# Patient Record
Sex: Male | Born: 1955 | Race: White | Hispanic: No | Marital: Married | Smoking: Never smoker
Health system: Southern US, Community
[De-identification: ages and names within clinical notes are randomized; demographics above are authoritative.]

## PROBLEM LIST (undated history)

## (undated) DIAGNOSIS — K635 Polyp of colon: Secondary | ICD-10-CM

## (undated) DIAGNOSIS — N200 Calculus of kidney: Secondary | ICD-10-CM

## (undated) DIAGNOSIS — N400 Enlarged prostate without lower urinary tract symptoms: Secondary | ICD-10-CM

## (undated) DIAGNOSIS — K219 Gastro-esophageal reflux disease without esophagitis: Secondary | ICD-10-CM

## (undated) DIAGNOSIS — IMO0002 Reserved for concepts with insufficient information to code with codable children: Secondary | ICD-10-CM

## (undated) HISTORY — DX: Reserved for concepts with insufficient information to code with codable children: IMO0002

## (undated) HISTORY — DX: Benign prostatic hyperplasia without lower urinary tract symptoms: N40.0

## (undated) HISTORY — DX: Polyp of colon: K63.5

## (undated) HISTORY — PX: CHOLECYSTECTOMY: SHX55

## (undated) HISTORY — DX: Gastro-esophageal reflux disease without esophagitis: K21.9

## (undated) HISTORY — DX: Calculus of kidney: N20.0

---

## 1998-06-26 ENCOUNTER — Emergency Department (HOSPITAL_COMMUNITY): Admission: EM | Admit: 1998-06-26 | Discharge: 1998-06-26 | Payer: Self-pay | Admitting: Emergency Medicine

## 2004-03-28 ENCOUNTER — Emergency Department (HOSPITAL_COMMUNITY): Admission: EM | Admit: 2004-03-28 | Discharge: 2004-03-28 | Payer: Self-pay | Admitting: Emergency Medicine

## 2005-10-23 ENCOUNTER — Emergency Department (HOSPITAL_COMMUNITY): Admission: EM | Admit: 2005-10-23 | Discharge: 2005-10-23 | Payer: Self-pay | Admitting: Emergency Medicine

## 2005-10-24 ENCOUNTER — Ambulatory Visit (HOSPITAL_BASED_OUTPATIENT_CLINIC_OR_DEPARTMENT_OTHER): Admission: RE | Admit: 2005-10-24 | Discharge: 2005-10-24 | Payer: Self-pay | Admitting: Urology

## 2005-10-24 ENCOUNTER — Ambulatory Visit (HOSPITAL_COMMUNITY): Admission: RE | Admit: 2005-10-24 | Discharge: 2005-10-24 | Payer: Self-pay | Admitting: Urology

## 2005-11-12 ENCOUNTER — Ambulatory Visit (HOSPITAL_COMMUNITY): Admission: RE | Admit: 2005-11-12 | Discharge: 2005-11-12 | Payer: Self-pay | Admitting: Urology

## 2006-07-07 IMAGING — CR DG ABDOMEN 1V
1 series · 1 of 1 positions shown · non-contrast
Comparison: None available.

CLINICAL DATA: Patient has a history of a right renal stone.  Preop.  
 ABDOMEN ? 1 VIEW:

[t abdomen supine]
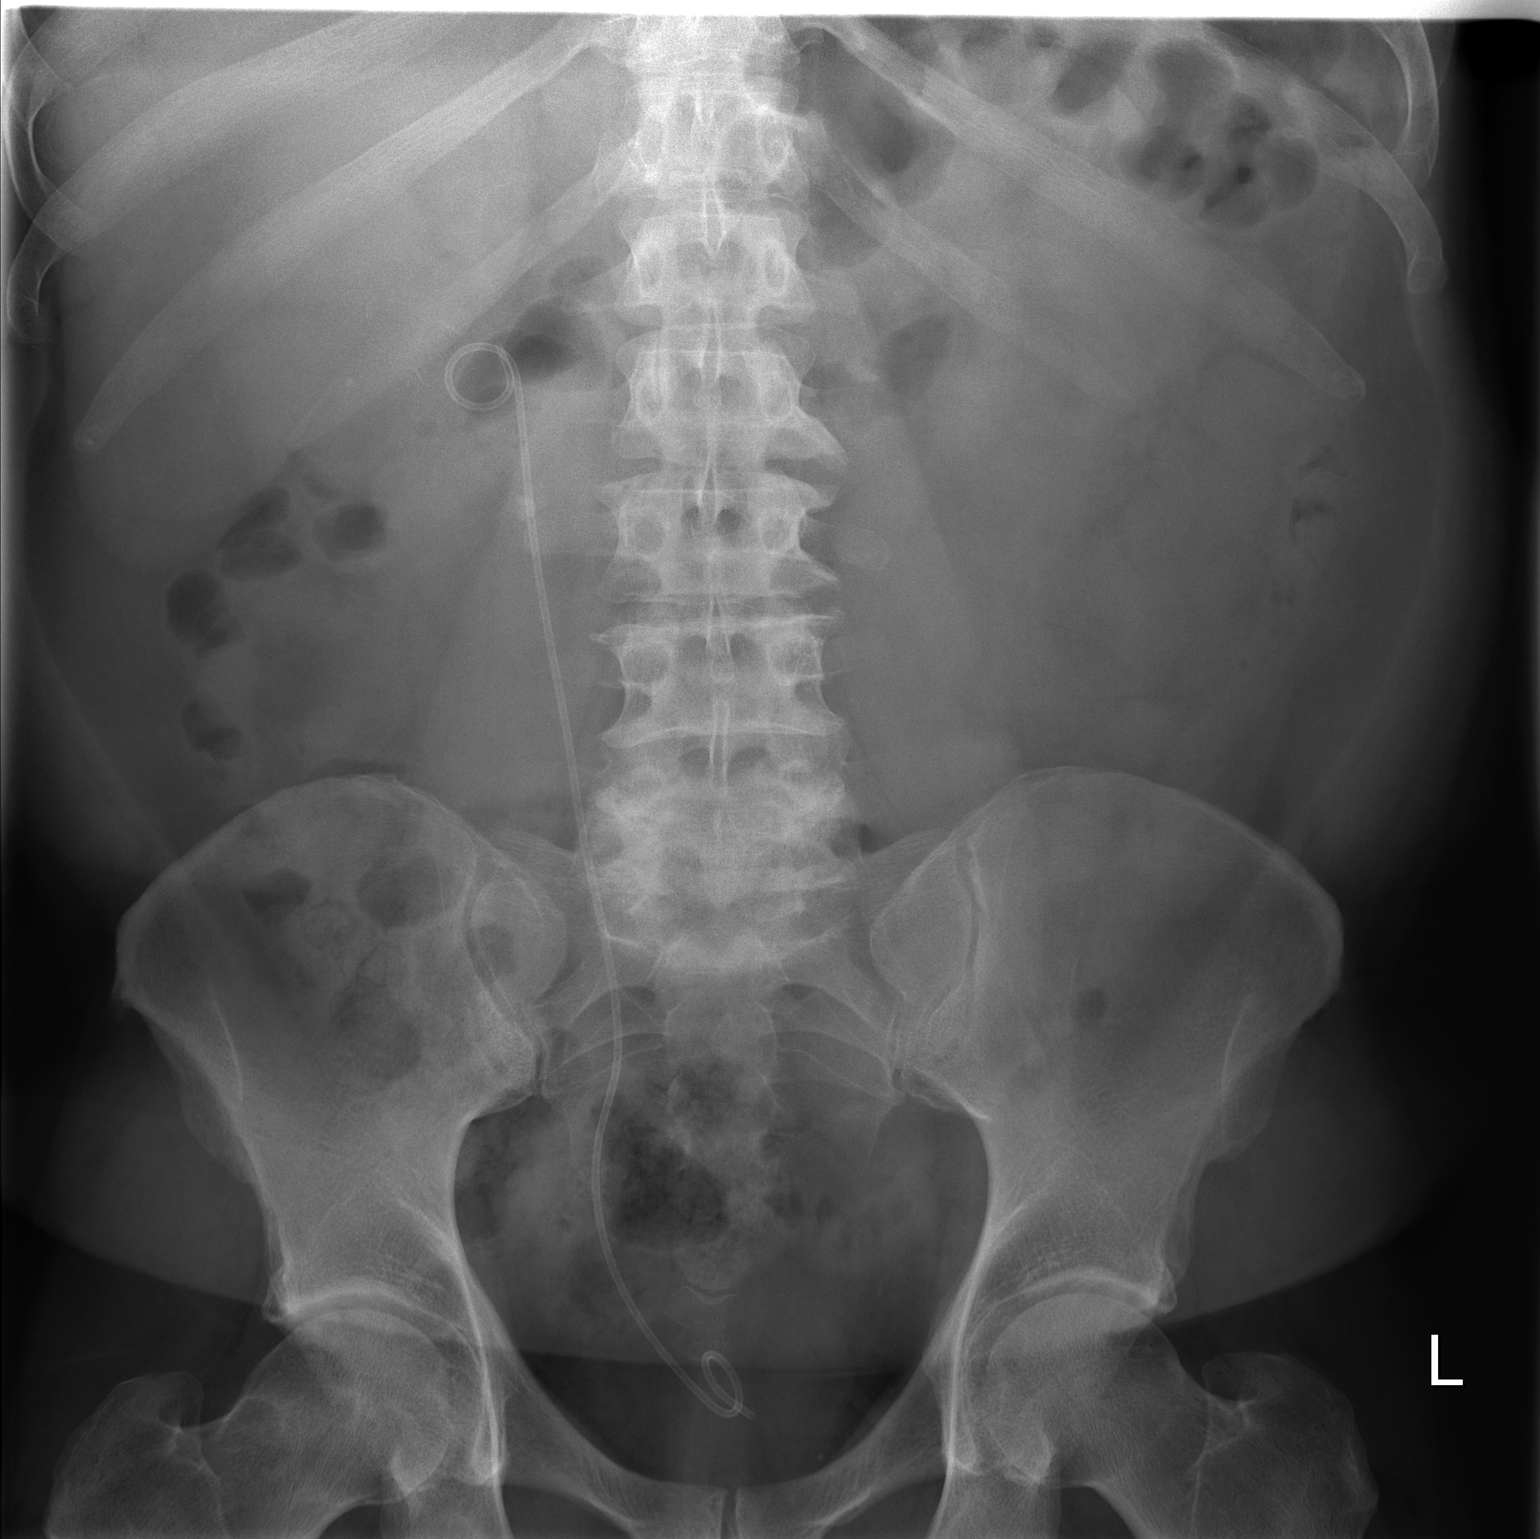

[1 of 1 positions shown; findings below may reference images not displayed]

FINDINGS: AP view of the abdomen reveals a right double J catheter to be in position.  There is a 6 mm calculus in the right proximal ureter.
IMPRESSION: Right proximal ureteral calculus.

## 2010-08-07 ENCOUNTER — Observation Stay (HOSPITAL_COMMUNITY): Admission: EM | Admit: 2010-08-07 | Discharge: 2010-08-08 | Payer: Self-pay | Admitting: Emergency Medicine

## 2010-08-07 ENCOUNTER — Ambulatory Visit: Payer: Self-pay | Admitting: Cardiology

## 2010-08-08 ENCOUNTER — Encounter (INDEPENDENT_AMBULATORY_CARE_PROVIDER_SITE_OTHER): Payer: Self-pay | Admitting: Emergency Medicine

## 2011-01-10 LAB — DIFFERENTIAL
Basophils Absolute: 0 10*3/uL (ref 0.0–0.1)
Eosinophils Relative: 1 % (ref 0–5)
Lymphocytes Relative: 34 % (ref 12–46)
Lymphs Abs: 1.6 10*3/uL (ref 0.7–4.0)
Monocytes Absolute: 0.3 10*3/uL (ref 0.1–1.0)
Monocytes Relative: 6 % (ref 3–12)

## 2011-01-10 LAB — BASIC METABOLIC PANEL
BUN: 16 mg/dL (ref 6–23)
Chloride: 106 mEq/L (ref 96–112)
Glucose, Bld: 107 mg/dL — ABNORMAL HIGH (ref 70–99)
Potassium: 4 mEq/L (ref 3.5–5.1)
Sodium: 139 mEq/L (ref 135–145)

## 2011-01-10 LAB — CBC
HCT: 42 % (ref 39.0–52.0)
Hemoglobin: 14 g/dL (ref 13.0–17.0)
MCV: 92.1 fL (ref 78.0–100.0)
RDW: 13.7 % (ref 11.5–15.5)
WBC: 4.7 10*3/uL (ref 4.0–10.5)

## 2011-01-10 LAB — POCT CARDIAC MARKERS
CKMB, poc: <1 ng/mL — ABNORMAL LOW (ref 1.0–8.0)
CKMB, poc: <1 ng/mL — ABNORMAL LOW (ref 1.0–8.0)
Myoglobin, poc: 76 ng/mL (ref 12–200)
Troponin i, poc: <0.05 ng/mL (ref 0.00–0.09)
Troponin i, poc: <0.05 ng/mL (ref 0.00–0.09)

## 2011-03-16 NOTE — Op Note (Signed)
NAME:  Jeffrey Hahn, Jeffrey Hahn             ACCOUNT NO.:  [id number]   MEDICAL RECORD NO.:  [id number]          PATIENT TYPE:  AMB   LOCATION:  [facility]                         FACILITY:  [facility]   PHYSICIAN:  Lindaann Slough, M.D.  DATE OF BIRTH:  1956/06/27   DATE OF PROCEDURE:  10/24/2005  DATE OF DISCHARGE:                                 OPERATIVE REPORT   PREOPERATIVE DIAGNOSIS:  Right ureteropelvic junction stone with  hydronephrosis.   POSTOPERATIVE DIAGNOSIS:  Right  ureteropelvic junction stone with  hydronephrosis.   PROCEDURE:  Cystoscopy, right retrograde pyelogram and insertion of double-J  catheter   SURGEON:  Danae Chen, M.D.   ANESTHESIA:  General.   INDICATION:  The patient is a 55 year old male who was seen in the office  this morning with a history of sudden onset of severe right flank pain  yesterday morning associated with nausea and vomiting. He went to the  emergency room and a CT scan showed a 6 mm stone at the right UPJ with  severe hydronephrosis. He has continued to complain of pain. He is scheduled  for cystoscopy, right retrograde pyelogram and insertion of double-J  catheter.   Under general anesthesia, the patient was prepped and draped and placed in  the dorsolithotomy position. A #22 Wappler cystoscope was inserted in the  bladder. The anterior urethra is normal. He has moderate prostatic  hypertrophy. The bladder mucosa is normal. There is no stone or tumor in the  bladder. The ureteral orifices are in normal position and shape.   RETROGRADE PYELOGRAM:   A cone-tip catheter was passed through the cystoscope and into the right  ureteral orifice. Contrast was then injected through the cone-tip catheter.  The ureter appears normal, there is no hydroureter. There is a stone at the  UPJ and the renal pelvis is moderately dilated. The calyces appear normal.  The cone-tip catheter was then removed.   A guidewire was then passed through the cystoscope and  into the right  ureter, then a #6-French - 26 double-J catheter was passed over the  guidewire. The proximal curl of the double-J catheter is in the renal pelvis  and the distal curl is in the bladder. The bladder was then emptied and the  cystoscope and guidewire were removed.   The patient tolerated the procedure well and left the OR in satisfactory  condition to post anesthesia care unit.      Lindaann Slough, M.D.  Electronically Signed    MN/MEDQ  D:  10/24/2005  T:  10/25/2005  Job:  [id number]

## 2011-03-19 ENCOUNTER — Other Ambulatory Visit: Payer: Self-pay | Admitting: Family Medicine

## 2011-03-19 ENCOUNTER — Ambulatory Visit
Admission: RE | Admit: 2011-03-19 | Discharge: 2011-03-19 | Disposition: A | Discharge status: Home / Self Care | Payer: 59 | Source: Ambulatory Visit | Attending: Family Medicine | Admitting: Family Medicine

## 2011-03-19 DIAGNOSIS — M542 Cervicalgia: Secondary | ICD-10-CM

## 2013-12-15 ENCOUNTER — Encounter: Payer: Self-pay | Admitting: Family Medicine

## 2013-12-25 ENCOUNTER — Encounter: Payer: Self-pay | Admitting: Family Medicine

## 2013-12-26 ENCOUNTER — Other Ambulatory Visit: Payer: Self-pay | Admitting: Family Medicine

## 2013-12-29 NOTE — Telephone Encounter (Signed)
Patient has not been seen by [facility]. Requires office visit before any refills can be given.    

## 2014-01-06 ENCOUNTER — Other Ambulatory Visit: Payer: 59

## 2014-01-06 DIAGNOSIS — Z0000 Encounter for general adult medical examination without abnormal findings: Secondary | ICD-10-CM

## 2014-01-06 LAB — CBC WITH DIFFERENTIAL/PLATELET
BASOS ABS: 0 10*3/uL (ref 0.0–0.1)
Basophils Relative: 0 % (ref 0–1)
EOS PCT: 3 % (ref 0–5)
Eosinophils Absolute: 0.1 10*3/uL (ref 0.0–0.7)
HCT: 41.1 % (ref 39.0–52.0)
Hemoglobin: 13.5 g/dL (ref 13.0–17.0)
LYMPHS PCT: 38 % (ref 12–46)
Lymphs Abs: 1.8 10*3/uL (ref 0.7–4.0)
MCH: 29.4 pg (ref 26.0–34.0)
MCHC: 32.8 g/dL (ref 30.0–36.0)
MCV: 89.5 fL (ref 78.0–100.0)
Monocytes Absolute: 0.3 10*3/uL (ref 0.1–1.0)
Monocytes Relative: 7 % (ref 3–12)
NEUTROS ABS: 2.4 10*3/uL (ref 1.7–7.7)
NEUTROS PCT: 52 % (ref 43–77)
PLATELETS: 238 10*3/uL (ref 150–400)
RBC: 4.59 MIL/uL (ref 4.22–5.81)
RDW: 14.1 % (ref 11.5–15.5)
WBC: 4.7 10*3/uL (ref 4.0–10.5)

## 2014-01-06 LAB — COMPREHENSIVE METABOLIC PANEL
ALT: 19 U/L (ref 0–53)
AST: 26 U/L (ref 0–37)
Albumin: 4 g/dL (ref 3.5–5.2)
Alkaline Phosphatase: 81 U/L (ref 39–117)
BUN: 18 mg/dL (ref 6–23)
CALCIUM: 9 mg/dL (ref 8.4–10.5)
CHLORIDE: 106 meq/L (ref 96–112)
CO2: 27 mEq/L (ref 19–32)
CREATININE: 0.82 mg/dL (ref 0.50–1.35)
Glucose, Bld: 91 mg/dL (ref 70–99)
POTASSIUM: 4.4 meq/L (ref 3.5–5.3)
Sodium: 143 mEq/L (ref 135–145)
Total Bilirubin: 0.5 mg/dL (ref 0.2–1.2)
Total Protein: 6.4 g/dL (ref 6.0–8.3)

## 2014-01-06 LAB — LIPID PANEL
CHOL/HDL RATIO: 3.8 ratio
Cholesterol: 165 mg/dL (ref 0–200)
HDL: 43 mg/dL (ref >39)
LDL Cholesterol: 112 mg/dL — ABNORMAL HIGH (ref 0–99)
Triglycerides: 51 mg/dL (ref <150)
VLDL: 10 mg/dL (ref 0–40)

## 2014-01-06 LAB — PSA: PSA: 2.35 ng/mL (ref <=4.00)

## 2014-01-08 ENCOUNTER — Encounter: Payer: Self-pay | Admitting: Family Medicine

## 2014-01-08 ENCOUNTER — Ambulatory Visit (INDEPENDENT_AMBULATORY_CARE_PROVIDER_SITE_OTHER): Payer: 59 | Admitting: Family Medicine

## 2014-01-08 VITALS — BP 136/84 | HR 74 | Temp 97.4°F | Resp 16 | Ht 74.0 in | Wt 251.0 lb

## 2014-01-08 DIAGNOSIS — Z0000 Encounter for general adult medical examination without abnormal findings: Secondary | ICD-10-CM

## 2014-01-08 DIAGNOSIS — N400 Enlarged prostate without lower urinary tract symptoms: Secondary | ICD-10-CM | POA: Insufficient documentation

## 2014-01-08 DIAGNOSIS — K635 Polyp of colon: Secondary | ICD-10-CM | POA: Insufficient documentation

## 2014-01-08 NOTE — Progress Notes (Signed)
Subjective:    Patient ID: Jeffrey Hahn, male    DOB: January 01, 1956, 58 y.o.   MRN: [id number]  HPI  Patient is here today for complete physical exam. He has no concerns. He has stable BPH. He experiences nocturia on occasion. However his symptoms are much improved on Flomax. His PSA has risen slightly from 2-2.3.  Patient's tetanus shot is up to date. His colonoscopy is due in 2017 for his history of colon polyps. Otherwise his preventive care is up to date. His most recent labwork as listed below: Lab on 01/06/2014  Component Date Value Ref Range Status  . WBC 01/06/2014 4.7  4.0 - 10.5 K/uL Final  . RBC 01/06/2014 4.59  4.22 - 5.81 MIL/uL Final  . Hemoglobin 01/06/2014 13.5  13.0 - 17.0 g/dL Final  . HCT 01/06/2014 41.1  39.0 - 52.0 % Final  . MCV 01/06/2014 89.5  78.0 - 100.0 fL Final  . MCH 01/06/2014 29.4  26.0 - 34.0 pg Final  . MCHC 01/06/2014 32.8  30.0 - 36.0 g/dL Final  . RDW 01/06/2014 14.1  11.5 - 15.5 % Final  . Platelets 01/06/2014 238  150 - 400 K/uL Final  . Neutrophils Relative % 01/06/2014 52  43 - 77 % Final  . Neutro Abs 01/06/2014 2.4  1.7 - 7.7 K/uL Final  . Lymphocytes Relative 01/06/2014 38  12 - 46 % Final  . Lymphs Abs 01/06/2014 1.8  0.7 - 4.0 K/uL Final  . Monocytes Relative 01/06/2014 7  3 - 12 % Final  . Monocytes Absolute 01/06/2014 0.3  0.1 - 1.0 K/uL Final  . Eosinophils Relative 01/06/2014 3  0 - 5 % Final  . Eosinophils Absolute 01/06/2014 0.1  0.0 - 0.7 K/uL Final  . Basophils Relative 01/06/2014 0  0 - 1 % Final  . Basophils Absolute 01/06/2014 0.0  0.0 - 0.1 K/uL Final  . Smear Review 01/06/2014 Criteria for review not met   Final  . Sodium 01/06/2014 143  135 - 145 mEq/L Final  . Potassium 01/06/2014 4.4  3.5 - 5.3 mEq/L Final  . Chloride 01/06/2014 106  96 - 112 mEq/L Final  . CO2 01/06/2014 27  19 - 32 mEq/L Final  . Glucose, Bld 01/06/2014 91  70 - 99 mg/dL Final  . BUN 01/06/2014 18  6 - 23 mg/dL Final  . Creat 01/06/2014 0.82  0.50 -  1.35 mg/dL Final  . Total Bilirubin 01/06/2014 0.5  0.2 - 1.2 mg/dL Final  . Alkaline Phosphatase 01/06/2014 81  39 - 117 U/L Final  . AST 01/06/2014 26  0 - 37 U/L Final  . ALT 01/06/2014 19  0 - 53 U/L Final  . Total Protein 01/06/2014 6.4  6.0 - 8.3 g/dL Final  . Albumin 01/06/2014 4.0  3.5 - 5.2 g/dL Final  . Calcium 01/06/2014 9.0  8.4 - 10.5 mg/dL Final  . Cholesterol 01/06/2014 165  0 - 200 mg/dL Final   Comment: ATP III Classification:                                < 200        mg/dL        Desirable                               200 - 239  mg/dL        Borderline High                               >= 240        mg/dL        High                             . Triglycerides 01/06/2014 51  <150 mg/dL Final  . HDL 01/06/2014 43  >39 mg/dL Final  . Total CHOL/HDL Ratio 01/06/2014 3.8   Final  . VLDL 01/06/2014 10  0 - 40 mg/dL Final  . LDL Cholesterol 01/06/2014 112* 0 - 99 mg/dL Final   Comment:                            Total Cholesterol/HDL Ratio:CHD Risk                                                 Coronary Heart Disease Risk Table                                                                 Men       Women                                   1/2 Average Risk              3.4        3.3                                       Average Risk              5.0        4.4                                    2X Average Risk              9.6        7.1                                    3X Average Risk             23.4       11.0                          Use the calculated Patient Ratio above and the CHD Risk table                           to determine the patient's CHD  Risk.                          ATP III Classification (LDL):                                < 100        mg/dL         Optimal                               100 - 129     mg/dL         Near or Above Optimal                               130 - 159     mg/dL         Borderline High                                160 - 189     mg/dL         High                                > 190        mg/dL         Very High                             . PSA 01/06/2014 2.35  <=4.00 ng/mL Final   Comment: Test Methodology: ECLIA PSA (Electrochemiluminescence Immunoassay)                                                     For PSA values from 2.5-4.0, particularly in younger men <60 years                          old, the AUA and NCCN suggest testing for % Free PSA (3515) and                          evaluation of the rate of increase in PSA (PSA velocity).   Past Medical History  Diagnosis Date  . BPH (benign prostatic hyperplasia)   . GERD (gastroesophageal reflux disease)   . Colon polyps   . Ulcer    Past Surgical History  Procedure Laterality Date  . Cholecystectomy     No current outpatient prescriptions on file prior to visit.   No current facility-administered medications on file prior to visit.   Allergies  Allergen Reactions  . Avelox [Moxifloxacin Hcl In Nacl] Hives and Shortness Of Breath  . Penicillins Hives and Shortness Of Breath   History   Social History  . Marital Status: Married    Spouse Name: N/A    Number of Children: N/A  . Years of Education: N/A   Occupational History  . Not on file.   Social History Main Topics  . Smoking  status: Never Smoker   . Smokeless tobacco: Not on file  . Alcohol Use: No  . Drug Use: No  . Sexual Activity: Yes     Comment: Married, works at [employer].   Other Topics Concern  . Not on file   Social History Narrative  . No narrative on file   Family History  Problem Relation Age of Onset  . Heart disease Brother 87    MI      Review of Systems  All other systems reviewed and are negative.       Objective:   Physical Exam  Vitals reviewed. Constitutional: He is oriented to person, place, and time. He appears well-developed and well-nourished. No distress.  HENT:  Head: Normocephalic and atraumatic.  Right Ear:  External ear normal.  Left Ear: External ear normal.  Nose: Nose normal.  Mouth/Throat: Oropharynx is clear and moist. No oropharyngeal exudate.  Eyes: Conjunctivae and EOM are normal. Pupils are equal, round, and reactive to light. Right eye exhibits no discharge. Left eye exhibits no discharge. No scleral icterus.  Neck: Normal range of motion. Neck supple. No JVD present. No tracheal deviation present. No thyromegaly present.  Cardiovascular: Normal rate, regular rhythm, normal heart sounds and intact distal pulses.  Exam reveals no gallop and no friction rub.   No murmur heard. Pulmonary/Chest: Effort normal and breath sounds normal. No stridor. No respiratory distress. He has no wheezes. He has no rales. He exhibits no tenderness.  Abdominal: Soft. Bowel sounds are normal. He exhibits no distension and no mass. There is no tenderness. There is no rebound and no guarding.  Genitourinary: Rectum normal and penis normal.  Musculoskeletal: Normal range of motion. He exhibits no edema and no tenderness.  Lymphadenopathy:    He has no cervical adenopathy.  Neurological: He is alert and oriented to person, place, and time. He has normal reflexes. He displays normal reflexes. No cranial nerve deficit. He exhibits normal muscle tone. Coordination normal.  Skin: Skin is warm. No rash noted. He is not diaphoretic. No erythema. No pallor.  Psychiatric: He has a normal mood and affect. His behavior is normal. Judgment and thought content normal.   prostate is enlarged +1 but there are no masses.        Assessment & Plan:  1. Routine general medical examination at a health care facility Patient's physical exam is completely normal aside from his obesity. I recommended that he lose 10-15 pounds of weight and increase his aerobic exercise. I will write him checking his blood pressure more frequently and notify me if it is greater than 140/90. Otherwise his labs are within normal range. His cancer  screenings are up-to-date. His immunizations are up-to-date. Recheck in one year or as needed.

## 2014-01-19 ENCOUNTER — Telehealth: Payer: Self-pay | Admitting: Family Medicine

## 2014-01-19 MED ORDER — TAMSULOSIN HCL 0.4 MG PO CAPS: 0.4000 mg | ORAL_CAPSULE | Freq: Every day | ORAL | Status: AC

## 2014-01-19 NOTE — Telephone Encounter (Signed)
Call back number [phone] [facility] is [facility] [address] [place] is needing tamsulosin (FLOMAX) 0.4 MG CAPS capsule

## 2014-01-19 NOTE — Telephone Encounter (Signed)
Rx Refilled  

## 2014-04-14 ENCOUNTER — Ambulatory Visit: Payer: 59 | Admitting: Physician Assistant

## 2015-01-10 ENCOUNTER — Other Ambulatory Visit: Payer: 59

## 2015-01-10 DIAGNOSIS — Z79899 Other long term (current) drug therapy: Secondary | ICD-10-CM

## 2015-01-10 DIAGNOSIS — Z0000 Encounter for general adult medical examination without abnormal findings: Secondary | ICD-10-CM

## 2015-01-10 DIAGNOSIS — N400 Enlarged prostate without lower urinary tract symptoms: Secondary | ICD-10-CM

## 2015-01-10 DIAGNOSIS — E785 Hyperlipidemia, unspecified: Secondary | ICD-10-CM

## 2015-01-10 LAB — COMPLETE METABOLIC PANEL WITH GFR
ALBUMIN: 4.2 g/dL (ref 3.5–5.2)
ALK PHOS: 83 U/L (ref 39–117)
ALT: 16 U/L (ref 0–53)
AST: 18 U/L (ref 0–37)
BILIRUBIN TOTAL: 0.7 mg/dL (ref 0.2–1.2)
BUN: 24 mg/dL — ABNORMAL HIGH (ref 6–23)
CHLORIDE: 105 meq/L (ref 96–112)
CO2: 26 mEq/L (ref 19–32)
Calcium: 9 mg/dL (ref 8.4–10.5)
Creat: 0.84 mg/dL (ref 0.50–1.35)
GFR, Est African American: >89 mL/min
Glucose, Bld: 92 mg/dL (ref 70–99)
POTASSIUM: 4.5 meq/L (ref 3.5–5.3)
SODIUM: 141 meq/L (ref 135–145)
TOTAL PROTEIN: 6.4 g/dL (ref 6.0–8.3)

## 2015-01-10 LAB — LIPID PANEL
Cholesterol: 185 mg/dL (ref 0–200)
HDL: 53 mg/dL (ref >=40)
LDL Cholesterol: 121 mg/dL — ABNORMAL HIGH (ref 0–99)
Total CHOL/HDL Ratio: 3.5 Ratio
Triglycerides: 55 mg/dL (ref <150)
VLDL: 11 mg/dL (ref 0–40)

## 2015-01-10 LAB — CBC WITH DIFFERENTIAL/PLATELET
BASOS PCT: 1 % (ref 0–1)
Basophils Absolute: 0.1 10*3/uL (ref 0.0–0.1)
EOS ABS: 0.2 10*3/uL (ref 0.0–0.7)
Eosinophils Relative: 3 % (ref 0–5)
HEMATOCRIT: 44.4 % (ref 39.0–52.0)
HEMOGLOBIN: 14.6 g/dL (ref 13.0–17.0)
LYMPHS ABS: 1.9 10*3/uL (ref 0.7–4.0)
Lymphocytes Relative: 37 % (ref 12–46)
MCH: 30.3 pg (ref 26.0–34.0)
MCHC: 32.9 g/dL (ref 30.0–36.0)
MCV: 92.1 fL (ref 78.0–100.0)
MONO ABS: 0.4 10*3/uL (ref 0.1–1.0)
MONOS PCT: 7 % (ref 3–12)
MPV: 10.1 fL (ref 8.6–12.4)
Neutro Abs: 2.7 10*3/uL (ref 1.7–7.7)
Neutrophils Relative %: 52 % (ref 43–77)
Platelets: 230 10*3/uL (ref 150–400)
RBC: 4.82 MIL/uL (ref 4.22–5.81)
RDW: 14.3 % (ref 11.5–15.5)
WBC: 5.1 10*3/uL (ref 4.0–10.5)

## 2015-01-10 LAB — TSH: TSH: 0.672 u[IU]/mL (ref 0.350–4.500)

## 2015-01-11 ENCOUNTER — Encounter: Payer: Self-pay | Admitting: Family Medicine

## 2015-01-11 ENCOUNTER — Ambulatory Visit (INDEPENDENT_AMBULATORY_CARE_PROVIDER_SITE_OTHER): Payer: 59 | Admitting: Family Medicine

## 2015-01-11 VITALS — BP 138/80 | HR 76 | Temp 98.1°F | Resp 18 | Ht 74.0 in | Wt 251.0 lb

## 2015-01-11 DIAGNOSIS — Z0000 Encounter for general adult medical examination without abnormal findings: Secondary | ICD-10-CM | POA: Diagnosis not present

## 2015-01-11 DIAGNOSIS — G4710 Hypersomnia, unspecified: Secondary | ICD-10-CM

## 2015-01-11 LAB — PSA: PSA: 2.88 ng/mL (ref <=4.00)

## 2015-01-11 NOTE — Progress Notes (Signed)
Subjective:    Patient ID: Jeffrey Hahn, male    DOB: 01/25/1956, 59 y.o.   MRN: [id number]  HPI Patient is here today for complete physical exam.  He does complain of hypersomnia. He has had apneic episodes in the middle of the night that wakes him up refills like he can't breathe. He feels tired frequently throughout the day. He has headaches frequently throughout the day. He does not feel rested. His last colonoscopy was approximately 3 years ago. He is scheduled to get a colonoscopy this year at his gastroenterologist and they schedule this. His prostate is checked regularly by his urologist. It was last checked 6 months ago and was normal. His most recent lab work as listed below: Lab on 01/10/2015  Component Date Value Ref Range Status  . Sodium 01/10/2015 141  135 - 145 mEq/L Final  . Potassium 01/10/2015 4.5  3.5 - 5.3 mEq/L Final  . Chloride 01/10/2015 105  96 - 112 mEq/L Final  . CO2 01/10/2015 26  19 - 32 mEq/L Final  . Glucose, Bld 01/10/2015 92  70 - 99 mg/dL Final  . BUN 01/10/2015 24* 6 - 23 mg/dL Final  . Creat 01/10/2015 0.84  0.50 - 1.35 mg/dL Final  . Total Bilirubin 01/10/2015 0.7  0.2 - 1.2 mg/dL Final  . Alkaline Phosphatase 01/10/2015 83  39 - 117 U/L Final  . AST 01/10/2015 18  0 - 37 U/L Final  . ALT 01/10/2015 16  0 - 53 U/L Final  . Total Protein 01/10/2015 6.4  6.0 - 8.3 g/dL Final  . Albumin 01/10/2015 4.2  3.5 - 5.2 g/dL Final  . Calcium 01/10/2015 9.0  8.4 - 10.5 mg/dL Final  . GFR, Est African American 01/10/2015 >89   Final  . GFR, Est Non African American 01/10/2015 >89   Final   Comment:   The estimated GFR is a calculation valid for adults (>=3 years old) that uses the CKD-EPI algorithm to adjust for age and sex. It is   not to be used for children, pregnant women, hospitalized patients,    patients on dialysis, or with rapidly changing kidney function. According to the NKDEP, eGFR >89 is normal, 60-89 shows mild impairment, 30-59 shows  moderate impairment, 15-29 shows severe impairment and <15 is ESRD.     . TSH 01/10/2015 0.672  0.350 - 4.500 uIU/mL Final  . Cholesterol 01/10/2015 185  0 - 200 mg/dL Final   Comment: ATP III Classification:       < 200        mg/dL        Desirable      200 - 239     mg/dL        Borderline High      >= 240        mg/dL        High     . Triglycerides 01/10/2015 55  <150 mg/dL Final  . HDL 01/10/2015 53  >=40 mg/dL Final   ** Please note change in reference range(s). **  . Total CHOL/HDL Ratio 01/10/2015 3.5   Final  . VLDL 01/10/2015 11  0 - 40 mg/dL Final  . LDL Cholesterol 01/10/2015 121* 0 - 99 mg/dL Final   Comment:   Total Cholesterol/HDL Ratio:CHD Risk                        Coronary Heart Disease Risk Table  Men       Women          1/2 Average Risk              3.4        3.3              Average Risk              5.0        4.4           2X Average Risk              9.6        7.1           3X Average Risk             23.4       11.0 Use the calculated Patient Ratio above and the CHD Risk table  to determine the patient's CHD Risk. ATP III Classification (LDL):       < 100        mg/dL         Optimal      100 - 129     mg/dL         Near or Above Optimal      130 - 159     mg/dL         Borderline High      160 - 189     mg/dL         High       > 190        mg/dL         Very High     . WBC 01/10/2015 5.1  4.0 - 10.5 K/uL Final  . RBC 01/10/2015 4.82  4.22 - 5.81 MIL/uL Final  . Hemoglobin 01/10/2015 14.6  13.0 - 17.0 g/dL Final  . HCT 01/10/2015 44.4  39.0 - 52.0 % Final  . MCV 01/10/2015 92.1  78.0 - 100.0 fL Final  . MCH 01/10/2015 30.3  26.0 - 34.0 pg Final  . MCHC 01/10/2015 32.9  30.0 - 36.0 g/dL Final  . RDW 01/10/2015 14.3  11.5 - 15.5 % Final  . Platelets 01/10/2015 230  150 - 400 K/uL Final  . MPV 01/10/2015 10.1  8.6 - 12.4 fL Final  . Neutrophils Relative % 01/10/2015 52  43 - 77 % Final  . Neutro Abs  01/10/2015 2.7  1.7 - 7.7 K/uL Final  . Lymphocytes Relative 01/10/2015 37  12 - 46 % Final  . Lymphs Abs 01/10/2015 1.9  0.7 - 4.0 K/uL Final  . Monocytes Relative 01/10/2015 7  3 - 12 % Final  . Monocytes Absolute 01/10/2015 0.4  0.1 - 1.0 K/uL Final  . Eosinophils Relative 01/10/2015 3  0 - 5 % Final  . Eosinophils Absolute 01/10/2015 0.2  0.0 - 0.7 K/uL Final  . Basophils Relative 01/10/2015 1  0 - 1 % Final  . Basophils Absolute 01/10/2015 0.1  0.0 - 0.1 K/uL Final  . Smear Review 01/10/2015 Criteria for review not met   Final  . PSA 01/10/2015 2.88  <=4.00 ng/mL Final   Comment: Test Methodology: ECLIA PSA (Electrochemiluminescence Immunoassay)   For PSA values from 2.5-4.0, particularly in younger men <80 years old, the AUA and NCCN suggest testing for % Free PSA (3515) and evaluation of the rate of increase in PSA (PSA velocity).    Past Medical History  Diagnosis  Date  . BPH (benign prostatic hyperplasia)   . GERD (gastroesophageal reflux disease)   . Colon polyps   . Ulcer    Past Surgical History  Procedure Laterality Date  . Cholecystectomy     Current Outpatient Prescriptions on File Prior to Visit  Medication Sig Dispense Refill  . Omega-3 Fatty Acids (FISH OIL PO) Take by mouth.    . RABEprazole (ACIPHEX) 20 MG tablet Take 20 mg by mouth daily.    . tamsulosin (FLOMAX) 0.4 MG CAPS capsule Take 1 capsule (0.4 mg total) by mouth daily. 30 capsule 11   No current facility-administered medications on file prior to visit.   Allergies  Allergen Reactions  . Avelox [Moxifloxacin Hcl In Nacl] Hives and Shortness Of Breath  . Penicillins Hives and Shortness Of Breath   History   Social History  . Marital Status: Married    Spouse Name: N/A  . Number of Children: N/A  . Years of Education: N/A   Occupational History  . Not on file.   Social History Main Topics  . Smoking status: Never Smoker   . Smokeless tobacco: Not on file  . Alcohol Use: No  . Drug  Use: No  . Sexual Activity: Yes     Comment: Married, works at [employer].   Other Topics Concern  . Not on file   Social History Narrative   Family History  Problem Relation Age of Onset  . Heart disease Brother 6    MI      Review of Systems  All other systems reviewed and are negative.      Objective:   Physical Exam  Constitutional: He is oriented to person, place, and time. He appears well-developed and well-nourished. No distress.  HENT:  Head: Normocephalic and atraumatic.  Right Ear: External ear normal.  Left Ear: External ear normal.  Nose: Nose normal.  Mouth/Throat: Oropharynx is clear and moist. No oropharyngeal exudate.  Eyes: Conjunctivae and EOM are normal. Pupils are equal, round, and reactive to light. Right eye exhibits no discharge. Left eye exhibits no discharge. No scleral icterus.  Neck: Normal range of motion. Neck supple. No JVD present. No tracheal deviation present. No thyromegaly present.  Cardiovascular: Normal rate, regular rhythm, normal heart sounds and intact distal pulses.  Exam reveals no gallop and no friction rub.   No murmur heard. Pulmonary/Chest: Effort normal and breath sounds normal. No stridor. No respiratory distress. He has no wheezes. He has no rales. He exhibits no tenderness.  Abdominal: Soft. Bowel sounds are normal. He exhibits no distension and no mass. There is no tenderness. There is no rebound and no guarding.  Musculoskeletal: Normal range of motion. He exhibits no edema or tenderness.  Lymphadenopathy:    He has no cervical adenopathy.  Neurological: He is alert and oriented to person, place, and time. He has normal reflexes. He displays normal reflexes. No cranial nerve deficit. He exhibits normal muscle tone. Coordination normal.  Skin: Skin is warm. No rash noted. He is not diaphoretic. No erythema. No pallor.  Psychiatric: He has a normal mood and affect. His behavior is normal. Judgment and thought content  normal.  Vitals reviewed.         Assessment & Plan:  Hypersomnia - Plan: Nocturnal polysomnography (NPSG)  Routine general medical examination at a health care facility  Patient's physical exam is normal. I will schedule the patient for a sleep study at his request. I believe the patient is also  likely having panic attacks at night. I discussed this with him that he would like to see a sleep study first. Cancer screening is up-to-date. Lab work is excellent. Recheck in one year or as needed.

## 2015-01-18 ENCOUNTER — Ambulatory Visit (INDEPENDENT_AMBULATORY_CARE_PROVIDER_SITE_OTHER): Payer: 59 | Admitting: Family Medicine

## 2015-01-18 ENCOUNTER — Encounter: Payer: Self-pay | Admitting: Family Medicine

## 2015-01-18 VITALS — BP 136/74 | HR 82 | Temp 98.2°F | Resp 16 | Ht 74.0 in | Wt 249.0 lb

## 2015-01-18 DIAGNOSIS — R319 Hematuria, unspecified: Secondary | ICD-10-CM

## 2015-01-18 LAB — URINALYSIS, ROUTINE W REFLEX MICROSCOPIC
Bilirubin Urine: NEGATIVE
GLUCOSE, UA: NEGATIVE mg/dL
Hgb urine dipstick: NEGATIVE
Ketones, ur: NEGATIVE mg/dL
LEUKOCYTES UA: NEGATIVE
Nitrite: NEGATIVE
PH: 6.5 (ref 5.0–8.0)
Protein, ur: NEGATIVE mg/dL
SPECIFIC GRAVITY, URINE: 1.025 (ref 1.005–1.030)
Urobilinogen, UA: 1 mg/dL (ref 0.0–1.0)

## 2015-01-18 NOTE — Progress Notes (Signed)
   Subjective:    Patient ID: Jeffrey Hahn, male    DOB: April 14, 1956, 59 y.o.   MRN: [id number]  Patient presents for Hematuria  patient here for recheck urinalysis. He was seen by his neurologist a few weeks ago at that time they get the standard urinalysis test as he has history of recurrent kidney stones. He had a small amount of blood in his urine and they were get a recheck in 1 month's time but he became very concerned about this and wanted it rechecked earlier. He has not actually had any gross hematuria and no change in his urinary pattern he did not have any kidney stones at his last visit. There was no evidence of any infection. He has no other particular concerns as well as the urine rechecked Non smoker   Review Of Systems:  GEN- denies fatigue, fever, weight loss,weakness, recent illness HEENT- denies eye drainage, change in vision, nasal discharge, CVS- denies chest pain, palpitations RESP- denies SOB, cough, wheeze ABD- denies N/V, change in stools, abd pain GU- denies dysuria, +hematuria, dribbling, incontinence MSK- denies joint pain, muscle aches, injury Neuro- denies headache, dizziness, syncope, seizure activity       Objective:    BP 136/74 mmHg  Pulse 82  Temp(Src) 98.2 F (36.8 C) (Oral)  Resp 16  Ht 6\' 2"  (1.88 m)  Wt 249 lb (112.946 kg)  BMI 31.96 kg/m2 GEN- NAD, alert and oriented x3        Assessment & Plan:      Problem List Items Addressed This Visit    None    Visit Diagnoses    Hematuria    -  Primary    Relevant Orders    Urinalysis, Routine w reflex microscopic (Completed)       Note: This dictation was prepared with Dragon dictation along with smaller phrase technology. Any transcriptional errors that result from this process are unintentional.

## 2015-01-18 NOTE — Patient Instructions (Signed)
Labs are normal Call if for any concerns

## 2015-02-14 ENCOUNTER — Encounter: Payer: Self-pay | Admitting: Family Medicine

## 2015-02-14 ENCOUNTER — Ambulatory Visit (INDEPENDENT_AMBULATORY_CARE_PROVIDER_SITE_OTHER): Payer: 59 | Admitting: Family Medicine

## 2015-02-14 VITALS — BP 132/78 | HR 76 | Temp 98.6°F | Resp 16 | Ht 74.0 in | Wt 246.0 lb

## 2015-02-14 DIAGNOSIS — B001 Herpesviral vesicular dermatitis: Secondary | ICD-10-CM | POA: Diagnosis not present

## 2015-02-14 DIAGNOSIS — J069 Acute upper respiratory infection, unspecified: Secondary | ICD-10-CM

## 2015-02-14 MED ORDER — AZITHROMYCIN 250 MG PO TABS: ORAL_TABLET | ORAL | Status: DC

## 2015-02-14 MED ORDER — VALACYCLOVIR HCL 1 G PO TABS: 2000.0000 mg | ORAL_TABLET | Freq: Two times a day (BID) | ORAL | Status: DC

## 2015-02-14 NOTE — Progress Notes (Signed)
Patient ID: Jeffrey CoolerMichael D Trumpower, male   DOB: 09/04/1956, 59 y.o.   MRN: [id number]   Subjective:    Patient ID: Jeffrey Hahn, male    DOB: 09/04/1956, 59 y.o.   MRN: [id number]  Patient presents for Illness and Fever Blister  chest congestion mild productive cough sinus drainage sore throat worsening over the past 4 days. [place] was [place] in [place] when he returned his symptoms started. He has not had any fever. He has not had any shortness of breath or chest pain. He has taken some over-the-counter medications with minimal improvement. He also has had a fever blister that popped up on his lower lip he rarely gets these. He has tried over-the-counter lip balms and Abreva with no improvement    Review Of Systems:  GEN- denies fatigue, fever, weight loss,weakness, recent illness HEENT- denies eye drainage, change in vision,+ nasal discharge, CVS- denies chest pain, palpitations RESP- denies SOB,+ cough, wheeze ABD- denies N/V, change in stools, abd pain GU- denies dysuria, hematuria, dribbling, incontinence MSK- denies joint pain, muscle aches, injury Neuro- denies headache, dizziness, syncope, seizure activity       Objective:    BP 132/78 mmHg  Pulse 76  Temp(Src) 98.6 F (37 C) (Oral)  Resp 16  Ht 6\' 2"  (1.88 m)  Wt 246 lb (111.585 kg)  BMI 31.57 kg/m2 GEN- NAD, alert and oriented x3 HEENT- PERRL, EOMI, non injected sclera, pink conjunctiva, MMM, oropharynx mild injection, TM clear bilat no effusion, no  maxillary sinus tenderness, inflammed turbinates,  Nasal drainage , Lower left lip- cold sore , no drainage Neck- Supple, no LAD CVS- RRR, no murmur RESP-CTAB Pulses- Radial 2+         Assessment & Plan:      Problem List Items Addressed This Visit    None    Visit Diagnoses    Acute URI    -  Primary    Add zpak, sudafed, OTC cough med    Relevant Medications    valACYclovir (VALTREX) 1000 MG tablet    azithromycin (ZITHROMAX) 250 MG tablet     Cold sore        Valtrex 2000mg  BID x 1 day    Relevant Medications    valACYclovir (VALTREX) 1000 MG tablet    azithromycin (ZITHROMAX) 250 MG tablet       Note: This dictation was prepared with Dragon dictation along with smaller phrase technology. Any transcriptional errors that result from this process are unintentional.

## 2015-02-14 NOTE — Patient Instructions (Signed)
Sudafed to decongestant ZPAK antibiotic Valtrex for the cold sore  F/u as needed

## 2015-04-29 ENCOUNTER — Encounter: Payer: Self-pay | Admitting: Family Medicine

## 2015-06-20 ENCOUNTER — Encounter: Payer: Self-pay | Admitting: Family Medicine

## 2015-06-20 ENCOUNTER — Ambulatory Visit (INDEPENDENT_AMBULATORY_CARE_PROVIDER_SITE_OTHER): Payer: 59 | Admitting: Family Medicine

## 2015-06-20 VITALS — BP 128/72 | HR 80 | Temp 98.3°F | Resp 14 | Ht 74.0 in | Wt 244.0 lb

## 2015-06-20 DIAGNOSIS — K645 Perianal venous thrombosis: Secondary | ICD-10-CM

## 2015-06-20 MED ORDER — HYDROCORTISONE ACE-PRAMOXINE 1-1 % RE FOAM: 1.0000 | Freq: Two times a day (BID) | RECTAL | Status: DC

## 2015-06-20 NOTE — Patient Instructions (Addendum)
[facility] off [facility] Keep bowel soft Use epsom salt sitz baths Apply cream as directed

## 2015-06-20 NOTE — Progress Notes (Signed)
Patient ID: Jeffrey Hahn, male   DOB: [birth year], 59 y.o.   MRN: [id number]   Subjective:    Patient ID: Jeffrey Hahn, male    DOB: 02-04-1956, 59 y.o.   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 He started to [facility] lax recently and that has helped referred his bowels to soft stools but last Thursday he noticed a small knot that was painful it is increased in size over the weekend. He denies any recent straining denies any blood in his bowels. He has history of BPH but his urine stream has been good with the use of his Flomax. His colonoscopy is up-to-date and he gets it every 5 years secondary to polyps. His gastroenterologist is in [facility]     Review Of Systems:  GEN- denies fatigue, fever, weight loss,weakness, recent illness HEENT- denies eye drainage, change in vision, nasal discharge, CVS- denies chest pain, palpitations RESP- denies SOB, cough, wheeze ABD- denies N/V, +change in stools, abd pain GU- denies dysuria, hematuria, dribbling, incontinence MSK- denies joint pain, muscle aches, injury Neuro- denies headache, dizziness, syncope, seizure activity       Objective:    BP 128/72 mmHg  Pulse 80  Temp(Src) 98.3 F (36.8 C) (Oral)  Resp 14  Ht  (1.88 m)  Wt 244 lb (110.678 kg)  BMI 31.31 kg/m2 GEN- NAD, alert and oriented x3 Skin- e rectum- thrombosed hemorroid palpated- nickle size 9 oclock position, TTP, rectal tone normal, FOBT negative, soft stool in valut, enlarged prostate         Assessment & Plan:      Problem List Items Addressed This Visit    None    Visit Diagnoses    External thrombosed hemorrhoids    -  Primary    As > 72 hours, will not attempt to open this, use sitz bath- epson salt, and proctafoam, continue miralax and increae fiber water. If does not improve f/u GI       Note: This dictation was  prepared with Dragon dictation along with smaller phrase technology. Any transcriptional errors that result from this process are unintentional.

## 2016-01-17 ENCOUNTER — Other Ambulatory Visit: Payer: Self-pay | Admitting: Family Medicine

## 2016-01-17 ENCOUNTER — Other Ambulatory Visit: Payer: 59

## 2016-01-17 DIAGNOSIS — K219 Gastro-esophageal reflux disease without esophagitis: Secondary | ICD-10-CM

## 2016-01-17 DIAGNOSIS — Z79899 Other long term (current) drug therapy: Secondary | ICD-10-CM

## 2016-01-17 DIAGNOSIS — Z0000 Encounter for general adult medical examination without abnormal findings: Secondary | ICD-10-CM

## 2016-01-17 DIAGNOSIS — N400 Enlarged prostate without lower urinary tract symptoms: Secondary | ICD-10-CM

## 2016-01-17 LAB — CBC WITH DIFFERENTIAL/PLATELET
BASOS ABS: 0 10*3/uL (ref 0.0–0.1)
Basophils Relative: 0 % (ref 0–1)
EOS ABS: 0.3 10*3/uL (ref 0.0–0.7)
EOS PCT: 5 % (ref 0–5)
HEMATOCRIT: 44.5 % (ref 39.0–52.0)
Hemoglobin: 14.3 g/dL (ref 13.0–17.0)
LYMPHS PCT: 37 % (ref 12–46)
Lymphs Abs: 1.9 10*3/uL (ref 0.7–4.0)
MCH: 29.2 pg (ref 26.0–34.0)
MCHC: 32.1 g/dL (ref 30.0–36.0)
MCV: 91 fL (ref 78.0–100.0)
MPV: 10.5 fL (ref 8.6–12.4)
Monocytes Absolute: 0.4 10*3/uL (ref 0.1–1.0)
Monocytes Relative: 8 % (ref 3–12)
NEUTROS PCT: 50 % (ref 43–77)
Neutro Abs: 2.6 10*3/uL (ref 1.7–7.7)
PLATELETS: 239 10*3/uL (ref 150–400)
RBC: 4.89 MIL/uL (ref 4.22–5.81)
RDW: 14.2 % (ref 11.5–15.5)
WBC: 5.1 10*3/uL (ref 4.0–10.5)

## 2016-01-17 LAB — COMPLETE METABOLIC PANEL WITH GFR
ALT: 13 U/L (ref 9–46)
AST: 16 U/L (ref 10–35)
Albumin: 3.9 g/dL (ref 3.6–5.1)
Alkaline Phosphatase: 98 U/L (ref 40–115)
BILIRUBIN TOTAL: 0.5 mg/dL (ref 0.2–1.2)
BUN: 22 mg/dL (ref 7–25)
CO2: 29 mmol/L (ref 20–31)
CREATININE: 0.83 mg/dL (ref 0.70–1.33)
Calcium: 9.1 mg/dL (ref 8.6–10.3)
Chloride: 105 mmol/L (ref 98–110)
GFR, Est African American: >89 mL/min (ref >=60)
GFR, Est Non African American: >89 mL/min (ref >=60)
GLUCOSE: 95 mg/dL (ref 70–99)
Potassium: 4.5 mmol/L (ref 3.5–5.3)
SODIUM: 142 mmol/L (ref 135–146)
TOTAL PROTEIN: 6.6 g/dL (ref 6.1–8.1)

## 2016-01-17 LAB — LIPID PANEL
CHOL/HDL RATIO: 4 ratio (ref <=5.0)
CHOLESTEROL: 180 mg/dL (ref 125–200)
HDL: 45 mg/dL (ref >=40)
LDL Cholesterol: 123 mg/dL (ref <130)
Triglycerides: 61 mg/dL (ref <150)
VLDL: 12 mg/dL (ref <30)

## 2016-01-17 LAB — TSH: TSH: 0.45 mIU/L (ref 0.40–4.50)

## 2016-01-18 LAB — PSA: PSA: 3.03 ng/mL (ref <=4.00)

## 2016-01-19 ENCOUNTER — Ambulatory Visit (INDEPENDENT_AMBULATORY_CARE_PROVIDER_SITE_OTHER): Payer: 59 | Admitting: Family Medicine

## 2016-01-19 ENCOUNTER — Encounter: Payer: Self-pay | Admitting: Family Medicine

## 2016-01-19 ENCOUNTER — Ambulatory Visit
Admission: RE | Admit: 2016-01-19 | Discharge: 2016-01-19 | Disposition: A | Discharge status: Home / Self Care | Payer: 59 | Source: Ambulatory Visit | Attending: Family Medicine | Admitting: Family Medicine

## 2016-01-19 VITALS — BP 132/80 | HR 80 | Temp 97.8°F | Resp 14 | Ht 74.0 in | Wt 241.0 lb

## 2016-01-19 DIAGNOSIS — Z0000 Encounter for general adult medical examination without abnormal findings: Secondary | ICD-10-CM

## 2016-01-19 DIAGNOSIS — R0789 Other chest pain: Secondary | ICD-10-CM | POA: Diagnosis not present

## 2016-01-19 NOTE — Progress Notes (Signed)
Subjective:    Patient ID: Jeffrey Hahn, male    DOB: June 04, 1956, 60 y.o.   MRN: [id number]  HPI  Patient is here today for complete physical exam.  He has his colonoscopy scheduled for later this year. Due to his history of colon polyps. He has just seen his urologist and had a rectal exam and a testicular exam performed. His PSA is normal. Per the patient's report he says that his prostate was normal on examination and he defers a repeat rectal exam.  He denies any risk factors for HIV or hepatitis C and declines screening for this. His tetanus shot and flu shot are up-to-date.  He does report 3 months of pain in the left side of his chest. The pain seems to be in the chest wall itself. It times it is tender to touch. The pain is located over the left lower sternal border. There are no palpable masses in the breast or in the muscle. There is no palpable bony abnormality. He denies any angina or shortness of breath. His most recent lab work as listed below:. Appointment on 01/17/2016  Component Date Value Ref Range Status  . Sodium 01/17/2016 142  135 - 146 mmol/L Final  . Potassium 01/17/2016 4.5  3.5 - 5.3 mmol/L Final  . Chloride 01/17/2016 105  98 - 110 mmol/L Final  . CO2 01/17/2016 29  20 - 31 mmol/L Final  . Glucose, Bld 01/17/2016 95  70 - 99 mg/dL Final  . BUN 01/17/2016 22  7 - 25 mg/dL Final  . Creat 01/17/2016 0.83  0.70 - 1.33 mg/dL Final  . Total Bilirubin 01/17/2016 0.5  0.2 - 1.2 mg/dL Final  . Alkaline Phosphatase 01/17/2016 98  40 - 115 U/L Final  . AST 01/17/2016 16  10 - 35 U/L Final  . ALT 01/17/2016 13  9 - 46 U/L Final  . Total Protein 01/17/2016 6.6  6.1 - 8.1 g/dL Final  . Albumin 01/17/2016 3.9  3.6 - 5.1 g/dL Final  . Calcium 01/17/2016 9.1  8.6 - 10.3 mg/dL Final  . GFR, Est African American 01/17/2016 >89  >=60 mL/min Final  . GFR, Est Non African American 01/17/2016 >89  >=60 mL/min Final   Comment:   The estimated GFR is a calculation valid for adults  (>=2 years old) that uses the CKD-EPI algorithm to adjust for age and sex. It is   not to be used for children, pregnant women, hospitalized patients,    patients on dialysis, or with rapidly changing kidney function. According to the NKDEP, eGFR >89 is normal, 60-89 shows mild impairment, 30-59 shows moderate impairment, 15-29 shows severe impairment and <15 is ESRD.     . TSH 01/17/2016 0.45  0.40 - 4.50 mIU/L Final  . Cholesterol 01/17/2016 180  125 - 200 mg/dL Final  . Triglycerides 01/17/2016 61  <150 mg/dL Final  . HDL 01/17/2016 45  >=40 mg/dL Final  . Total CHOL/HDL Ratio 01/17/2016 4.0  <=5.0 Ratio Final  . VLDL 01/17/2016 12  <30 mg/dL Final  . LDL Cholesterol 01/17/2016 123  <130 mg/dL Final   Comment:   Total Cholesterol/HDL Ratio:CHD Risk                        Coronary Heart Disease Risk Table  Men       Women          1/2 Average Risk              3.4        3.3              Average Risk              5.0        4.4           2X Average Risk              9.6        7.1           3X Average Risk             23.4       11.0 Use the calculated Patient Ratio above and the CHD Risk table  to determine the patient's CHD Risk.   . WBC 01/17/2016 5.1  4.0 - 10.5 K/uL Final  . RBC 01/17/2016 4.89  4.22 - 5.81 MIL/uL Final  . Hemoglobin 01/17/2016 14.3  13.0 - 17.0 g/dL Final  . HCT 01/17/2016 44.5  39.0 - 52.0 % Final  . MCV 01/17/2016 91.0  78.0 - 100.0 fL Final  . MCH 01/17/2016 29.2  26.0 - 34.0 pg Final  . MCHC 01/17/2016 32.1  30.0 - 36.0 g/dL Final  . RDW 01/17/2016 14.2  11.5 - 15.5 % Final  . Platelets 01/17/2016 239  150 - 400 K/uL Final  . MPV 01/17/2016 10.5  8.6 - 12.4 fL Final  . Neutrophils Relative % 01/17/2016 50  43 - 77 % Final  . Neutro Abs 01/17/2016 2.6  1.7 - 7.7 K/uL Final  . Lymphocytes Relative 01/17/2016 37  12 - 46 % Final  . Lymphs Abs 01/17/2016 1.9  0.7 - 4.0 K/uL Final  . Monocytes Relative 01/17/2016  8  3 - 12 % Final  . Monocytes Absolute 01/17/2016 0.4  0.1 - 1.0 K/uL Final  . Eosinophils Relative 01/17/2016 5  0 - 5 % Final  . Eosinophils Absolute 01/17/2016 0.3  0.0 - 0.7 K/uL Final  . Basophils Relative 01/17/2016 0  0 - 1 % Final  . Basophils Absolute 01/17/2016 0.0  0.0 - 0.1 K/uL Final  . Smear Review 01/17/2016 Criteria for review not met   Final  . PSA 01/17/2016 3.03  <=4.00 ng/mL Final   Comment: Test Methodology: ECLIA PSA (Electrochemiluminescence Immunoassay)   For PSA values from 2.5-4.0, particularly in younger men <44 years old, the AUA and NCCN suggest testing for % Free PSA (3515) and evaluation of the rate of increase in PSA (PSA velocity).    Past Medical History  Diagnosis Date  . BPH (benign prostatic hyperplasia)   . GERD (gastroesophageal reflux disease)   . Colon polyps   . Ulcer   . Kidney stones     Dr. Tommi Rumps- [facility]   Past Surgical History  Procedure Laterality Date  . Cholecystectomy     Current Outpatient Prescriptions on File Prior to Visit  Medication Sig Dispense Refill  . Calcium Carbonate-Vit D-Min (CALCIUM 1200 PO) Take by mouth.    . Cholecalciferol (VITAMIN D) 2000 UNITS CAPS Take by mouth.    . hydrocortisone-pramoxine (PROCTOFOAM HC) rectal foam Place 1 applicator rectally 2 (two) times daily. 10 g 2  . MULTIPLE VITAMIN PO Take by mouth.    . RABEprazole (ACIPHEX) 20 MG tablet Take 20  mg by mouth daily.    . tamsulosin (FLOMAX) 0.4 MG CAPS capsule Take 1 capsule (0.4 mg total) by mouth daily. 30 capsule 11  . valACYclovir (VALTREX) 1000 MG tablet Take 2 tablets (2,000 mg total) by mouth 2 (two) times daily. X 1 DAY FOR COLD SORE 4 tablet 1   No current facility-administered medications on file prior to visit.   Allergies  Allergen Reactions  . Avelox [Moxifloxacin Hcl In Nacl] Hives and Shortness Of Breath  . Penicillins Hives and Shortness Of Breath   Social History   Social History  . Marital Status: Married    Spouse  Name: N/A  . Number of Children: N/A  . Years of Education: N/A   Occupational History  . Not on file.   Social History Main Topics  . Smoking status: Never Smoker   . Smokeless tobacco: Not on file  . Alcohol Use: No  . Drug Use: No  . Sexual Activity: Yes     Comment: Married, works at [employer].   Other Topics Concern  . Not on file   Social History Narrative   Family History  Problem Relation Age of Onset  . Heart disease Brother 60    MI      Review of Systems  All other systems reviewed and are negative.      Objective:   Physical Exam  Constitutional: He is oriented to person, place, and time. He appears well-developed and well-nourished. No distress.  HENT:  Head: Normocephalic and atraumatic.  Right Ear: External ear normal.  Left Ear: External ear normal.  Nose: Nose normal.  Mouth/Throat: Oropharynx is clear and moist. No oropharyngeal exudate.  Eyes: Conjunctivae and EOM are normal. Pupils are equal, round, and reactive to light. Right eye exhibits no discharge. Left eye exhibits no discharge. No scleral icterus.  Neck: Normal range of motion. Neck supple. No JVD present. No tracheal deviation present. No thyromegaly present.  Cardiovascular: Normal rate, regular rhythm, normal heart sounds and intact distal pulses.  Exam reveals no gallop and no friction rub.   No murmur heard. Pulmonary/Chest: Effort normal and breath sounds normal. No stridor. No respiratory distress. He has no wheezes. He has no rales. He exhibits no tenderness.  Abdominal: Soft. Bowel sounds are normal. He exhibits no distension and no mass. There is no tenderness. There is no rebound and no guarding.  Musculoskeletal: Normal range of motion. He exhibits no edema or tenderness.  Lymphadenopathy:    He has no cervical adenopathy.  Neurological: He is alert and oriented to person, place, and time. He has normal reflexes. No cranial nerve deficit. He exhibits normal muscle  tone. Coordination normal.  Skin: Skin is warm. No rash noted. He is not diaphoretic. No erythema. No pallor.  Psychiatric: He has a normal mood and affect. His behavior is normal. Judgment and thought content normal.  Vitals reviewed.         Assessment & Plan:  Routine general medical examination at a health care facility His exam today is completely normal. His blood work is excellent. He declines HIV or hepatitis C screening. His colonoscopy has already been scheduled. His prostate exam was reportedly normal and his PSA is normal. I believe he is having chest wall pain likely due to a sore muscle. I will obtain a chest x-ray to be thorough but his exam today is completely normal and it certainly does not sound cardiac in nature. Regular anticipatory guidance is provided.

## 2016-01-23 ENCOUNTER — Other Ambulatory Visit: Payer: Self-pay | Admitting: Family Medicine

## 2016-01-23 DIAGNOSIS — J9859 Other diseases of mediastinum, not elsewhere classified: Secondary | ICD-10-CM

## 2016-01-27 ENCOUNTER — Ambulatory Visit
Admission: RE | Admit: 2016-01-27 | Discharge: 2016-01-27 | Disposition: A | Discharge status: Home / Self Care | Payer: 59 | Source: Ambulatory Visit | Attending: Family Medicine | Admitting: Family Medicine

## 2016-01-27 DIAGNOSIS — J9859 Other diseases of mediastinum, not elsewhere classified: Secondary | ICD-10-CM

## 2016-01-27 MED ORDER — IOPAMIDOL (ISOVUE-300) INJECTION 61%
75.0000 mL | Freq: Once | INTRAVENOUS | Status: AC | PRN
  Administered 2016-01-27: 75 mL via INTRAVENOUS

## 2016-08-21 ENCOUNTER — Ambulatory Visit (INDEPENDENT_AMBULATORY_CARE_PROVIDER_SITE_OTHER): Payer: 59 | Admitting: Family Medicine

## 2016-08-21 ENCOUNTER — Encounter: Payer: Self-pay | Admitting: Family Medicine

## 2016-08-21 VITALS — BP 130/86 | HR 86 | Temp 98.1°F | Resp 16 | Ht 74.0 in | Wt 243.0 lb

## 2016-08-21 DIAGNOSIS — R05 Cough: Secondary | ICD-10-CM | POA: Diagnosis not present

## 2016-08-21 DIAGNOSIS — R053 Chronic cough: Secondary | ICD-10-CM

## 2016-08-21 NOTE — Progress Notes (Signed)
Subjective:    Patient ID: Jeffrey Hahn, male    DOB: 14-Apr-1956, 60 y.o.   MRN: [id number]  HPI The patient has been battling a chronic cough for more than 6 weeks. He states that he has been seen by Dr. for this on 2 separate occasions. Approximately one month ago he was given an antibiotic for sinus infection with no benefit. He then was seen and given an antibiotic and a prednisone taper pack for "bronchitis" with no benefit although the prednisone seemed to help his cough while he was taking it. The cough primarily occurs at night. He also notices some wheezing and some chest congestion with it. He is using Flonase. He denies any postnasal drip or sinus congestion. He is also on a proton pump inhibitor and he denies any acid reflux. He denies any fevers or chills or hemoptysis or weight loss or night sweats. He has not had a chest x-ray. He was diagnosed with asthma as a child. He is tried an albuterol inhaler at night and it did seem to help his cough  Past Medical History:  Diagnosis Date  . BPH (benign prostatic hyperplasia)   . Colon polyps   . GERD (gastroesophageal reflux disease)   . Kidney stones    Dr. Zachery ConchFriedman- [facility]  . Ulcer [facility])    Past Surgical History:  Procedure Laterality Date  . CHOLECYSTECTOMY     Current Outpatient Prescriptions on File Prior to Visit  Medication Sig Dispense Refill  . Bioflavonoid Products (BIOFLEX PO) Take by mouth.    . Calcium Carbonate-Vit D-Min (CALCIUM 1200 PO) Take by mouth.    . Cholecalciferol (VITAMIN D) 2000 UNITS CAPS Take by mouth.    . MULTIPLE VITAMIN PO Take by mouth.    . RABEprazole (ACIPHEX) 20 MG tablet Take 20 mg by mouth daily.    . tamsulosin (FLOMAX) 0.4 MG CAPS capsule Take 1 capsule (0.4 mg total) by mouth daily. 30 capsule 11   No current facility-administered medications on file prior to visit.    Allergies  Allergen Reactions  . Avelox [Moxifloxacin Hcl In Nacl] Hives and Shortness Of Breath  . Penicillins  Hives and Shortness Of Breath   Social History   Social History  . Marital status: Married    Spouse name: N/A  . Number of children: N/A  . Years of education: N/A   Occupational History  . Not on file.   Social History Main Topics  . Smoking status: Never Smoker  . Smokeless tobacco: Not on file  . Alcohol use No  . Drug use: No  . Sexual activity: Yes     Comment: Married, works at [employer].   Other Topics Concern  . Not on file   Social History Narrative  . No narrative on file   Family History  Problem Relation Age of Onset  . Heart disease Brother 4948    MI      Review of Systems  All other systems reviewed and are negative.      Objective:   Physical Exam  Constitutional: He is oriented to person, place, and time. He appears well-developed and well-nourished. No distress.  HENT:  Head: Normocephalic and atraumatic.  Right Ear: External ear normal.  Left Ear: External ear normal.  Nose: Nose normal.  Mouth/Throat: Oropharynx is clear and moist. No oropharyngeal exudate.  Eyes: Conjunctivae and EOM are normal. Pupils are equal, round, and reactive to light. Right eye exhibits no discharge. Left  eye exhibits no discharge. No scleral icterus.  Neck: Normal range of motion. Neck supple. No JVD present. No tracheal deviation present. No thyromegaly present.  Cardiovascular: Normal rate, regular rhythm, normal heart sounds and intact distal pulses.  Exam reveals no gallop and no friction rub.   No murmur heard. Pulmonary/Chest: Effort normal and breath sounds normal. No stridor. No respiratory distress. He has no wheezes. He has no rales. He exhibits no tenderness.  Abdominal: Soft. Bowel sounds are normal. He exhibits no distension and no mass. There is no tenderness. There is no rebound and no guarding.  Musculoskeletal: Normal range of motion. He exhibits no edema or tenderness.  Lymphadenopathy:    He has no cervical adenopathy.  Neurological: He  is alert and oriented to person, place, and time. He has normal reflexes. No cranial nerve deficit. He exhibits normal muscle tone. Coordination normal.  Skin: Skin is warm. No rash noted. He is not diaphoretic. No erythema. No pallor.  Psychiatric: He has a normal mood and affect. His behavior is normal. Judgment and thought content normal.  Vitals reviewed.         Assessment & Plan:  Chronic cough His exam today is completely normal. I suspect that the patient had an upper respiratory infection that may have triggered some type of cough variant asthma. Therefore I will give the patient Symbicort 160/4.5, 2 puffs inhaled twice daily and reassess in 1-2 weeks. If the cough persists, proceed with a chest x-ray and pulmonary function test.

## 2020-01-15 ENCOUNTER — Ambulatory Visit: Payer: 59 | Attending: Internal Medicine

## 2020-01-15 DIAGNOSIS — Z23 Encounter for immunization: Secondary | ICD-10-CM

## 2020-01-15 NOTE — Progress Notes (Signed)
   Covid-19 Vaccination Clinic  Name:  NILS THOR    MRN: [id number] DOB: 09-Mar-1956  01/15/2020  Mr. Enyeart was observed post Covid-19 immunization for 15 minutes without incident. He was provided with Vaccine Information Sheet and instruction to access the V-Safe system.   Mr. Shawn was instructed to call 911 with any severe reactions post vaccine: Marland Kitchen Difficulty breathing  . Swelling of face and throat  . A fast heartbeat  . A bad rash all over body  . Dizziness and weakness   Immunizations Administered    Name Date Dose VIS Date Route   Pfizer COVID-19 Vaccine 01/15/2020  8:52 AM 0.3 mL 10/09/2019 Intramuscular   Manufacturer: [employer], [employer]   Lot: [id number]   NDC: [address]-4

## 2020-02-09 ENCOUNTER — Ambulatory Visit: Payer: 59 | Attending: Internal Medicine

## 2020-02-09 DIAGNOSIS — Z23 Encounter for immunization: Secondary | ICD-10-CM

## 2020-02-09 NOTE — Progress Notes (Signed)
   Covid-19 Vaccination Clinic  Name:  Jeffrey Hahn    MRN: [id number] DOB: Jun 02, 1956  02/09/2020  [name] was observed post Covid-19 immunization for 15 minutes without incident. He was provided with Vaccine Information Sheet and instruction to access the V-Safe system.   [name] was instructed to call 911 with any severe reactions post vaccine: Marland Kitchen Difficulty breathing  . Swelling of face and throat  . A fast heartbeat  . A bad rash all over body  . Dizziness and weakness   Immunizations Administered    Name Date Dose VIS Date Route   Pfizer COVID-19 Vaccine 02/09/2020  9:17 AM 0.3 mL 10/09/2019 Intramuscular   Manufacturer: [employer], [employer]   Lot: [id number]   NDC: [address]-0
